# Patient Record
Sex: Male | Born: 2000 | Race: Asian | Hispanic: No | Marital: Single | Smoking: Never smoker
Health system: Southern US, Community
[De-identification: ages and names within clinical notes are randomized; demographics above are authoritative.]

## PROBLEM LIST (undated history)

## (undated) DIAGNOSIS — E669 Obesity, unspecified: Secondary | ICD-10-CM

## (undated) HISTORY — DX: Obesity, unspecified: E66.9

---
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
## 2022-01-28 ENCOUNTER — Telehealth: Payer: Self-pay | Admitting: Internal Medicine

## 2022-01-28 NOTE — Telephone Encounter (Signed)
[name] [phone]  Sergio Morgan is wanting to come in and see you about his acne, he said he had been told that if he seen his PCP and had a referral that he could be seen sooner than he could get an appointment. 6 months or more.

## 2022-01-28 NOTE — Telephone Encounter (Signed)
Patient called [facility] in [place]. I suggested he try some of the Dermatology offices in [place] and see if maybe they could see him sooner. I let him know we could not get him in sooner unless it was an emergency. He verbalized understanding and will call back if he needs anything else.

## 2022-10-27 DIAGNOSIS — L818 Other specified disorders of pigmentation: Secondary | ICD-10-CM | POA: Diagnosis not present

## 2022-10-27 DIAGNOSIS — L700 Acne vulgaris: Secondary | ICD-10-CM | POA: Diagnosis not present

## 2023-01-31 DIAGNOSIS — L700 Acne vulgaris: Secondary | ICD-10-CM | POA: Diagnosis not present

## 2023-01-31 DIAGNOSIS — L818 Other specified disorders of pigmentation: Secondary | ICD-10-CM | POA: Diagnosis not present

## 2023-05-10 DIAGNOSIS — L700 Acne vulgaris: Secondary | ICD-10-CM | POA: Diagnosis not present

## 2023-05-10 DIAGNOSIS — L818 Other specified disorders of pigmentation: Secondary | ICD-10-CM | POA: Diagnosis not present
